# Patient Record
Sex: Male | Born: 1990 | Race: Black or African American | Hispanic: No | Marital: Single | Smoking: Current every day smoker
Health system: Southern US, Community
[De-identification: ages and names within clinical notes are randomized; demographics above are authoritative.]

## PROBLEM LIST (undated history)

## (undated) DIAGNOSIS — K5190 Ulcerative colitis, unspecified, without complications: Secondary | ICD-10-CM

---
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
## 2009-06-20 ENCOUNTER — Emergency Department (HOSPITAL_COMMUNITY): Admission: EM | Admit: 2009-06-20 | Discharge: 2009-06-20 | Payer: Self-pay | Admitting: Emergency Medicine

## 2009-06-23 ENCOUNTER — Inpatient Hospital Stay (HOSPITAL_COMMUNITY): Admission: EM | Admit: 2009-06-23 | Discharge: 2009-06-25 | Payer: Self-pay | Admitting: Emergency Medicine

## 2009-06-23 ENCOUNTER — Ambulatory Visit: Payer: Self-pay | Admitting: Family Medicine

## 2009-06-24 ENCOUNTER — Encounter: Payer: Self-pay | Admitting: Family Medicine

## 2009-07-02 ENCOUNTER — Ambulatory Visit: Payer: Self-pay | Admitting: Family Medicine

## 2009-07-02 DIAGNOSIS — K5289 Other specified noninfective gastroenteritis and colitis: Secondary | ICD-10-CM | POA: Insufficient documentation

## 2009-07-19 ENCOUNTER — Encounter (INDEPENDENT_AMBULATORY_CARE_PROVIDER_SITE_OTHER): Payer: Self-pay | Admitting: *Deleted

## 2009-07-19 DIAGNOSIS — F17200 Nicotine dependence, unspecified, uncomplicated: Secondary | ICD-10-CM

## 2010-08-16 NOTE — Miscellaneous (Signed)
Summary: [name]  Clinical Lists Changes  Problems: Added new problem of TOBACCO Prim Morace (ICD-305.1) 

## 2010-08-31 ENCOUNTER — Encounter: Payer: Self-pay | Admitting: *Deleted

## 2010-10-18 LAB — POCT I-STAT, CHEM 8
BUN: 5 mg/dL — ABNORMAL LOW (ref 6–23)
Calcium, Ion: 1.11 mmol/L — ABNORMAL LOW (ref 1.12–1.32)
Chloride: 100 meq/L (ref 96–112)
Creatinine, Ser: 1.1 mg/dL (ref 0.4–1.5)
Glucose, Bld: 93 mg/dL (ref 70–99)
HCT: 35 % — ABNORMAL LOW (ref 39.0–52.0)
Hemoglobin: 11.9 g/dL — ABNORMAL LOW (ref 13.0–17.0)
Potassium: 3.5 meq/L (ref 3.5–5.1)
Sodium: 140 mEq/L (ref 135–145)
TCO2: 27 mmol/L (ref 0–100)

## 2010-10-18 LAB — URINALYSIS, ROUTINE W REFLEX MICROSCOPIC
Bilirubin Urine: NEGATIVE
Nitrite: NEGATIVE
Specific Gravity, Urine: 1.026 (ref 1.005–1.030)
Urobilinogen, UA: 0.2 mg/dL (ref 0.0–1.0)
pH: 6 (ref 5.0–8.0)

## 2010-10-18 LAB — HELICOBACTER PYLORI ANTIGEN DET, STOOL

## 2010-10-18 LAB — BASIC METABOLIC PANEL
BUN: 5 mg/dL — ABNORMAL LOW (ref 6–23)
Calcium: 8.8 mg/dL (ref 8.4–10.5)
Creatinine, Ser: 1.16 mg/dL (ref 0.4–1.5)
GFR calc non Af Amer: >60 mL/min (ref >60)
Glucose, Bld: 109 mg/dL — ABNORMAL HIGH (ref 70–99)
Potassium: 3.5 mEq/L (ref 3.5–5.1)

## 2010-10-18 LAB — DIFFERENTIAL
Basophils Absolute: 0 10*3/uL (ref 0.0–0.1)
Basophils Relative: 0 % (ref 0–1)
Lymphocytes Relative: 17 % (ref 12–46)
Monocytes Absolute: 1.1 10*3/uL — ABNORMAL HIGH (ref 0.1–1.0)
Neutro Abs: 5.3 10*3/uL (ref 1.7–7.7)
Neutrophils Relative %: 68 % (ref 43–77)

## 2010-10-18 LAB — HIV ANTIBODY (ROUTINE TESTING W REFLEX): HIV: NONREACTIVE

## 2010-10-18 LAB — CBC
HCT: 29.9 % — ABNORMAL LOW (ref 39.0–52.0)
Hemoglobin: 10 g/dL — ABNORMAL LOW (ref 13.0–17.0)
Hemoglobin: 11.9 g/dL — ABNORMAL LOW (ref 13.0–17.0)
MCHC: 32.5 g/dL (ref 30.0–36.0)
Platelets: 354 10*3/uL (ref 150–400)
RBC: 3.55 MIL/uL — ABNORMAL LOW (ref 4.22–5.81)
RDW: 12.8 % (ref 11.5–15.5)

## 2010-10-18 LAB — COMPREHENSIVE METABOLIC PANEL
Alkaline Phosphatase: 41 U/L (ref 39–117)
BUN: 3 mg/dL — ABNORMAL LOW (ref 6–23)
Chloride: 102 mEq/L (ref 96–112)
Creatinine, Ser: 1.1 mg/dL (ref 0.4–1.5)
GFR calc non Af Amer: >60 mL/min (ref >60)
Glucose, Bld: 120 mg/dL — ABNORMAL HIGH (ref 70–99)
Potassium: 3.3 mEq/L — ABNORMAL LOW (ref 3.5–5.1)
Total Bilirubin: 0.6 mg/dL (ref 0.3–1.2)

## 2010-10-18 LAB — URINE MICROSCOPIC-ADD ON

## 2010-10-18 LAB — CLOSTRIDIUM DIFFICILE EIA
C difficile Toxins A+B, EIA: NEGATIVE
C difficile Toxins A+B, EIA: NEGATIVE

## 2010-10-18 LAB — SEDIMENTATION RATE: Sed Rate: 86 mm/hr — ABNORMAL HIGH (ref 0–16)

## 2010-10-18 LAB — GIARDIA/CRYPTOSPORIDIUM SCREEN(EIA): Cryptosporidium Screen (EIA): NEGATIVE

## 2010-10-18 LAB — STOOL CULTURE

## 2010-10-18 LAB — HEMOCCULT GUIAC POC 1CARD (OFFICE): Fecal Occult Bld: POSITIVE

## 2010-12-02 NOTE — Assessment & Plan Note (Signed)
[facility]                          [place] OFFICE NOTE   NAME:Barry Velez, Barry Velez                         MRN:          [id number]  DATE:02/19/2006                            DOB:          April 04, 1991    CHIEF COMPLAINT:  Headache.   HISTORY OF PRESENT ILLNESS:  Barry Velez is a 20 year old African-American male  who came to the office today because of headaches.  The patient states that  the headaches are going on for years.  They sometimes happen at the right  side of the head and they could be on the left or the middle.  Usually he  has a quick and complete response to Tylenol #2 p.o. one time.  There is no  associated nausea, vomiting, or dizziness.  He has not noticed any triggers  but suspects that sometimes salty food may cause the headaches.  As far as  the frequency, he states he has about four to five episodes a month.  There  are no premonitory symptoms.   PAST MEDICAL HISTORY:  Negative   FAMILY HISTORY:  1.  Mother used to have migraines earlier in her life but now she is      asymptomatic.  2.  No history of colon or prostate cancer.  3.  No history of heart disease.  4.  Grandmother has diabetes.   SOCIAL HISTORY:  Does not smoke.  Has drunk alcohol before and does it very  seldom.   CURRENT MEDICATIONS:  None on a routine basis.   ALLERGIES:  No known drug allergies.   REVIEW OF SYSTEMS:  Other than the headache, he does very well.  He does  well in school.  He denies any URI type of symptoms or allergies.   PHYSICAL EXAMINATION:  GENERAL:  The patient was alert and oriented and in  no acute distress.  VITAL SIGNS:  He weighs 217 pounds.  Blood pressure 130/68, pulse 72,  respirations 14.  LUNGS:  Clear to auscultation bilaterally.  CARDIOVASCULAR:  Regular rate and rhythm without a murmur.  EXTREMITIES:  No edema.  HEENT:  Ears, nose and throat are normal.  NEUROLOGIC:  Speech, gait and motor are intact.  Reflexes  are symmetric.  Extraocular movements are normal.  Pupils equal, round and reactive to light  and accommodation.   ASSESSMENT/PLAN:  1.  The patient has headaches that are recurrent.  There are no associated      symptoms or clear triggers.  Most likely however, he has migraines.      They respond very well to a low dose of Tylenol.  They are not      debilitating and he told me he has not skipped school due to the      migraines ever.  At this point I think he will be better served by      keeping Tylenol with him and use it p.r.n. rather than be on preventive      medicine daily.  The patient agrees with that approach and so does the      mother.  I  told him that should the migraines change, get more frequent      or more intense, to let me know.  I will consider either further work-up      or start some prophylactic medicine.  2.  Come back to the office p.r.n.                                   Willow Ora, MD   JP/MedQ  DD:  02/19/2006  DT:  02/20/2006  Job #:  [id number]

## 2011-02-07 IMAGING — CR DG ABDOMEN ACUTE W/ 1V CHEST
3 series · 3 of 3 positions shown · non-contrast
Comparison: None

CLINICAL DATA: Abdominal pain

ACUTE ABDOMEN SERIES (ABDOMEN 2 VIEW & CHEST 1 VIEW)

[w chest pa]
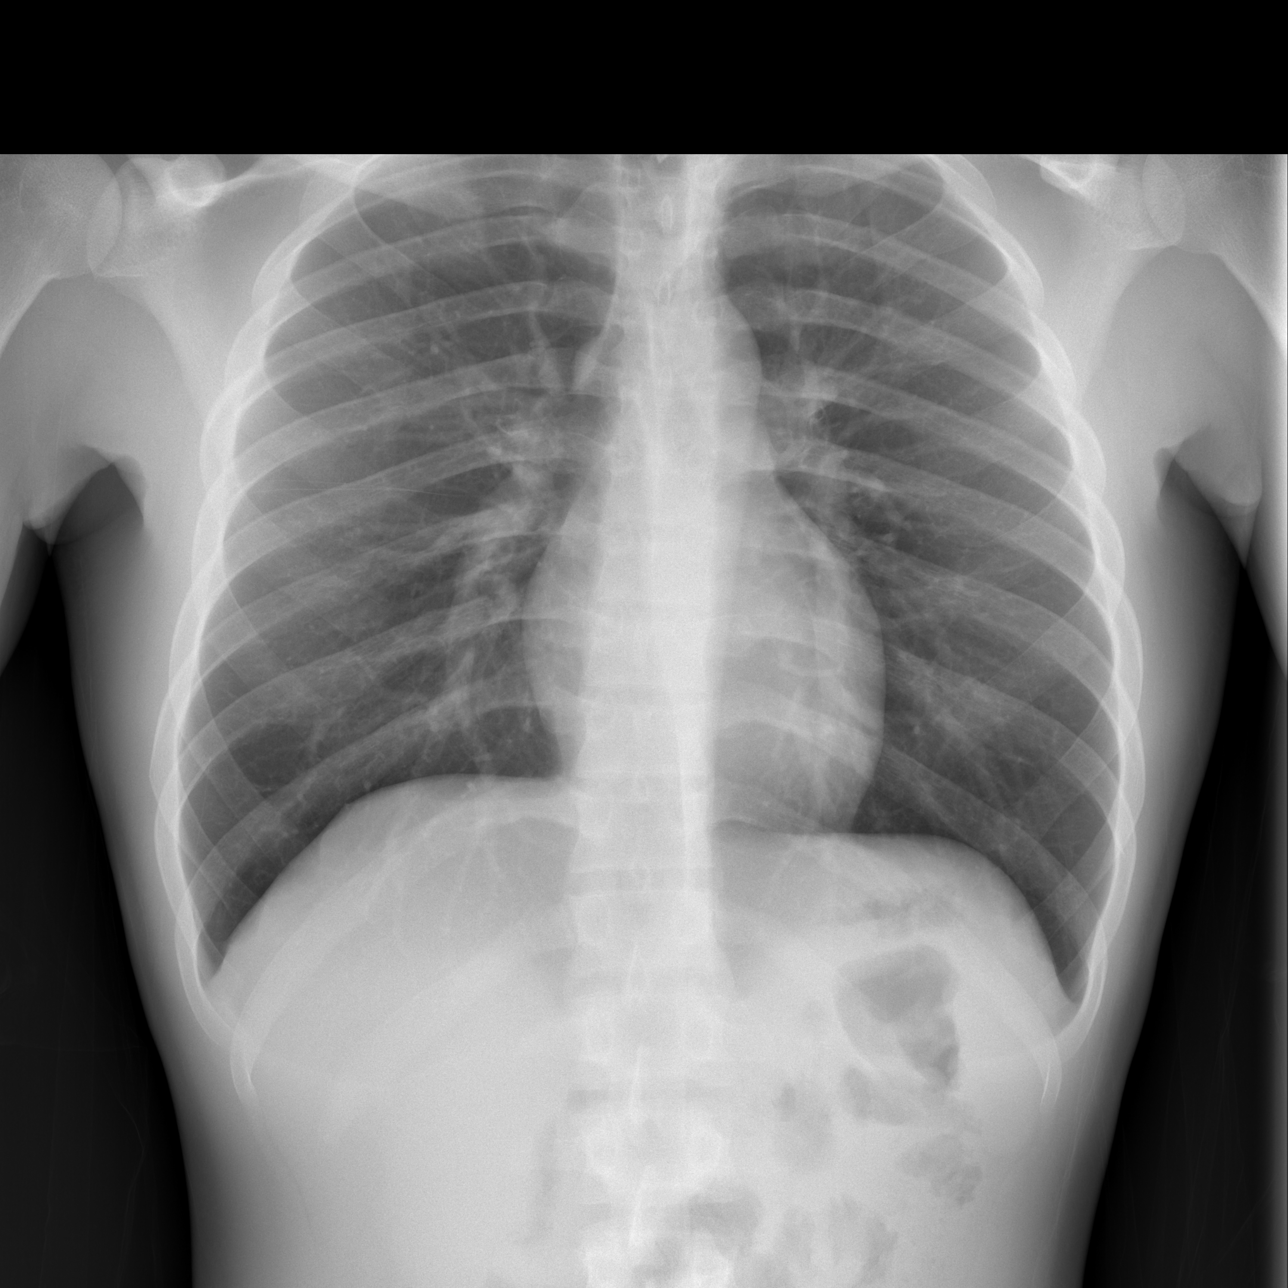

[w abdomen upright *]
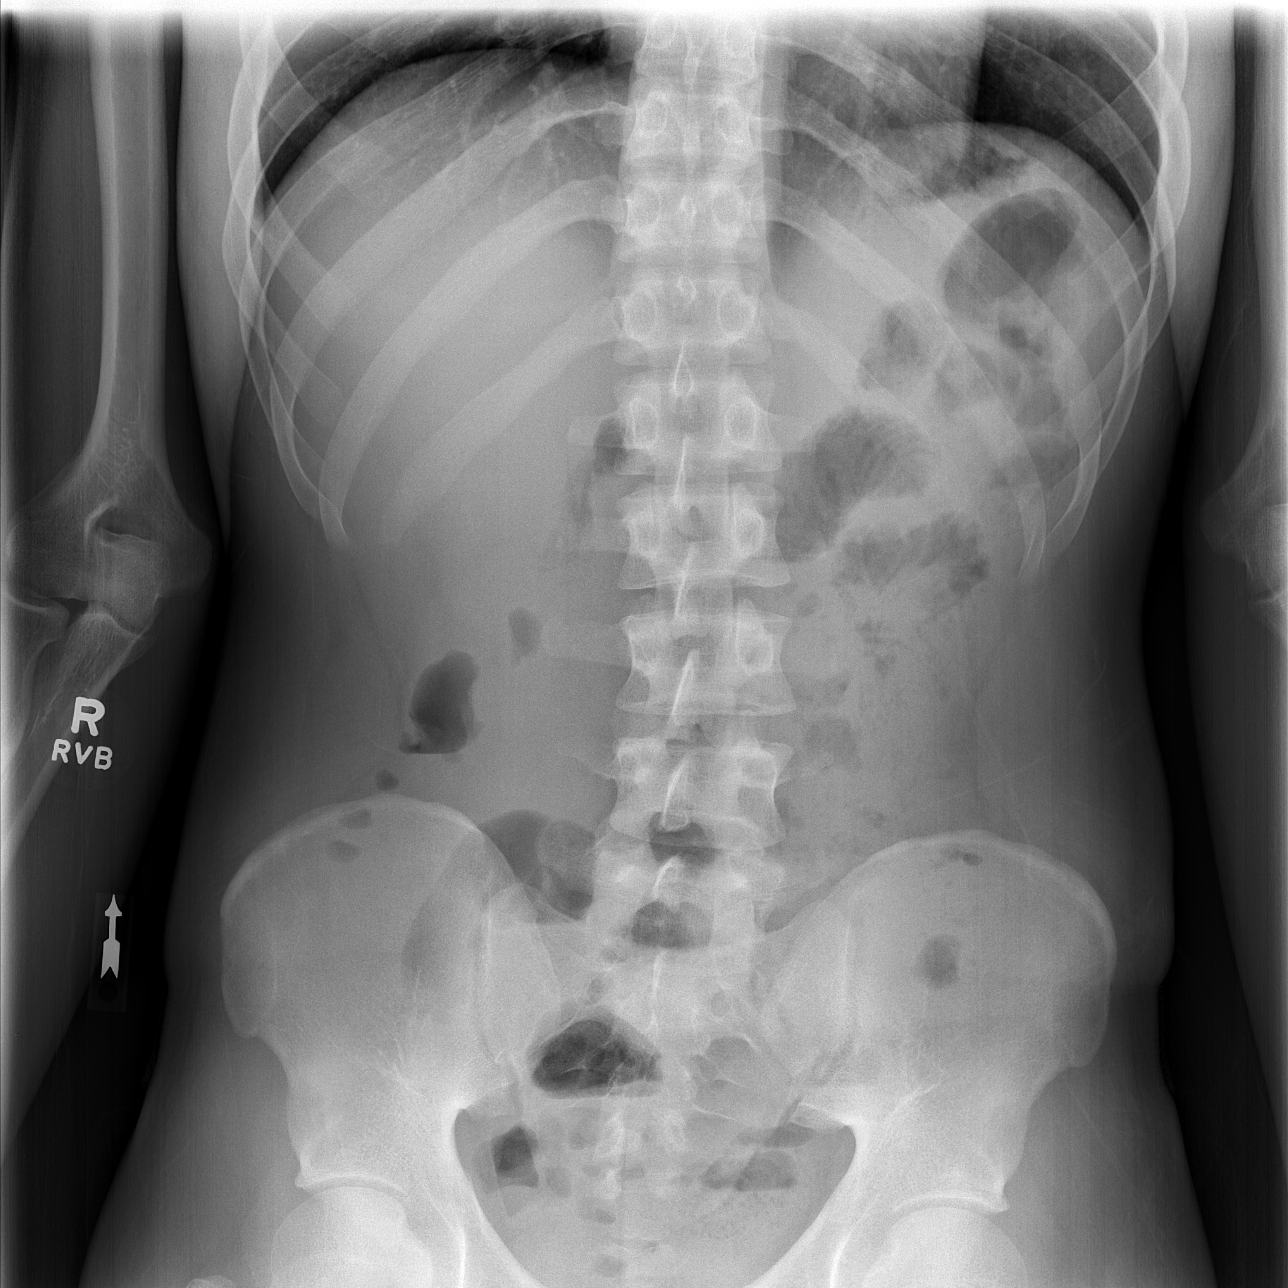

[t abdomen supine]
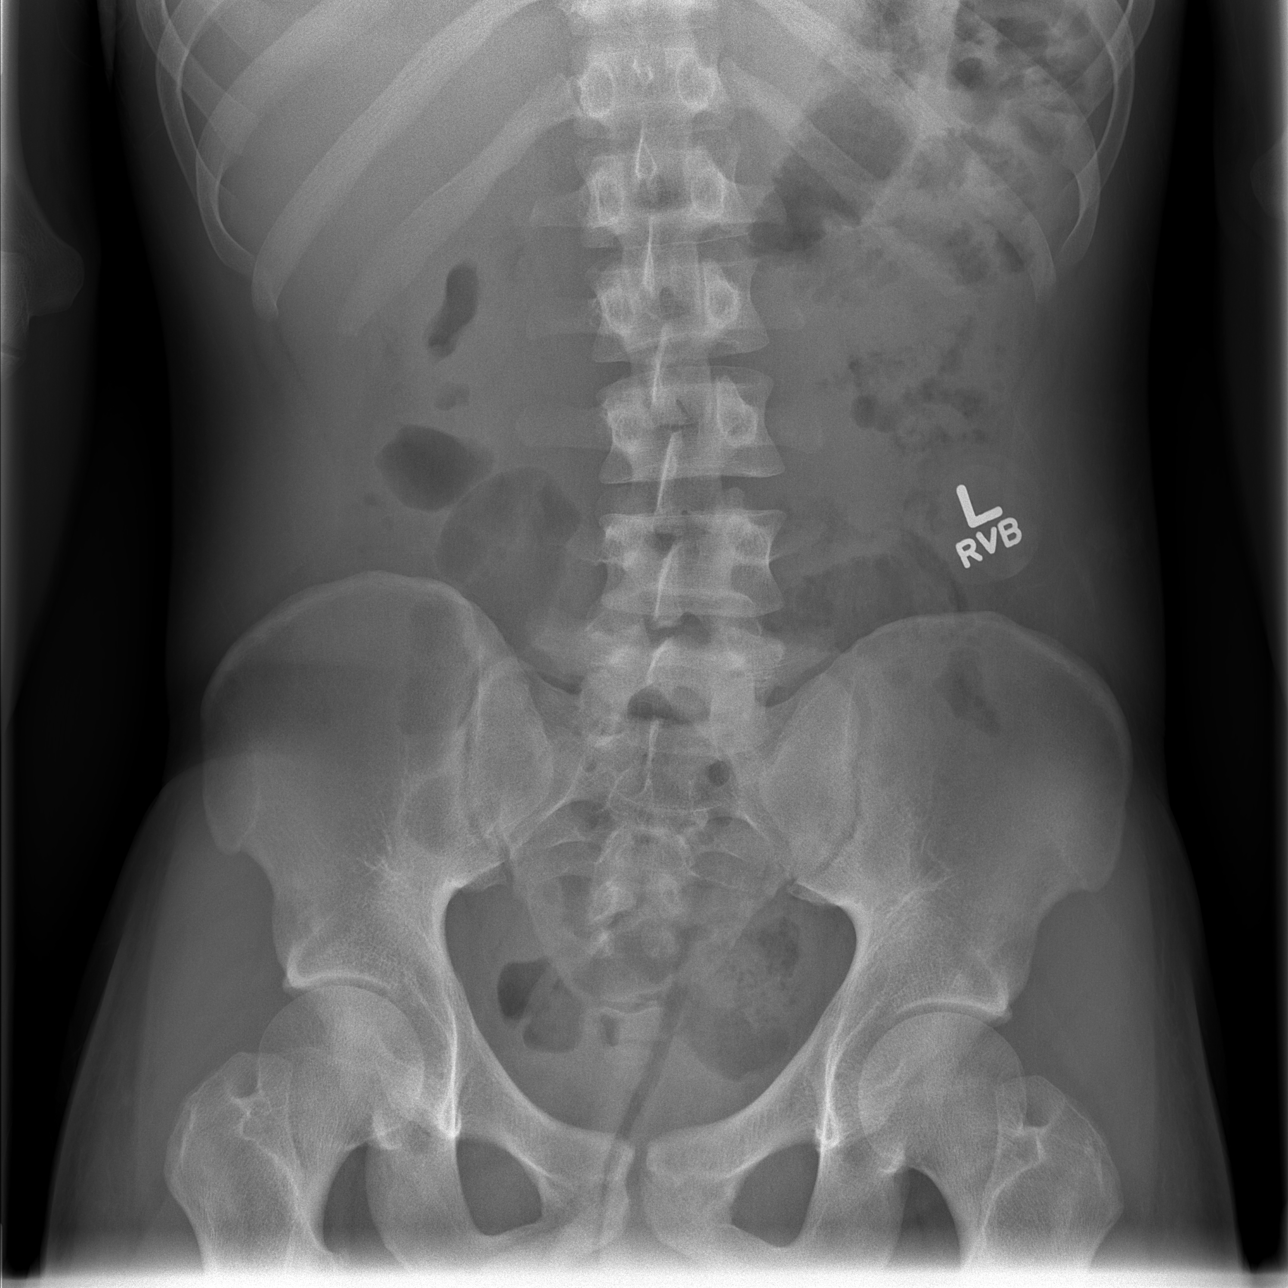

[3 of 3 positions shown; findings below may reference images not displayed]

FINDINGS: Heart size is normal and the lungs are clear.  No
infiltrate or effusion.

Mildly distended small bowel loops in left upper quadrant could be
due to early obstruction or ileus.  There is no free air.  The
colon is not dilated.  There are no abnormal calcifications and
there is no bony abnormality.
IMPRESSION: No acute cardiopulmonary disease.

Mildly dilated loops of jejunum which could be due to ileus or
partial obstruction.

## 2015-10-05 ENCOUNTER — Encounter (HOSPITAL_COMMUNITY): Payer: Self-pay | Admitting: Emergency Medicine

## 2015-10-05 ENCOUNTER — Emergency Department (HOSPITAL_COMMUNITY): Payer: Self-pay

## 2015-10-05 ENCOUNTER — Emergency Department (HOSPITAL_COMMUNITY)
Admission: EM | Admit: 2015-10-05 | Discharge: 2015-10-05 | Disposition: A | Discharge status: Home / Self Care | Payer: Self-pay | Attending: Emergency Medicine | Admitting: Emergency Medicine

## 2015-10-05 DIAGNOSIS — N453 Epididymo-orchitis: Secondary | ICD-10-CM | POA: Insufficient documentation

## 2015-10-05 DIAGNOSIS — Z8719 Personal history of other diseases of the digestive system: Secondary | ICD-10-CM | POA: Insufficient documentation

## 2015-10-05 DIAGNOSIS — F17200 Nicotine dependence, unspecified, uncomplicated: Secondary | ICD-10-CM | POA: Insufficient documentation

## 2015-10-05 DIAGNOSIS — N50819 Testicular pain, unspecified: Secondary | ICD-10-CM

## 2015-10-05 HISTORY — DX: Ulcerative colitis, unspecified, without complications: K51.90

## 2015-10-05 LAB — URINALYSIS, ROUTINE W REFLEX MICROSCOPIC
BILIRUBIN URINE: NEGATIVE
GLUCOSE, UA: NEGATIVE mg/dL
Ketones, ur: NEGATIVE mg/dL
Nitrite: NEGATIVE
PROTEIN: 100 mg/dL — AB
SPECIFIC GRAVITY, URINE: 1.026 (ref 1.005–1.030)
pH: 6 (ref 5.0–8.0)

## 2015-10-05 LAB — URINE MICROSCOPIC-ADD ON

## 2015-10-05 MED ORDER — ACETAMINOPHEN 500 MG PO TABS
1000.0000 mg | ORAL_TABLET | Freq: Once | ORAL | Status: AC
  Administered 2015-10-05: 1000 mg via ORAL
  Filled 2015-10-05: qty 2

## 2015-10-05 MED ORDER — ACETAMINOPHEN 325 MG PO TABS
650.0000 mg | ORAL_TABLET | Freq: Once | ORAL | Status: AC | PRN
  Administered 2015-10-05: 650 mg via ORAL
  Filled 2015-10-05: qty 2

## 2015-10-05 MED ORDER — CEFTRIAXONE SODIUM 250 MG IJ SOLR
250.0000 mg | Freq: Once | INTRAMUSCULAR | Status: AC
  Administered 2015-10-05: 250 mg via INTRAMUSCULAR
  Filled 2015-10-05: qty 250

## 2015-10-05 MED ORDER — CEPHALEXIN 500 MG PO CAPS: 500.0000 mg | ORAL_CAPSULE | Freq: Two times a day (BID) | ORAL | Status: AC

## 2015-10-05 MED ORDER — AZITHROMYCIN 250 MG PO TABS
1000.0000 mg | ORAL_TABLET | Freq: Once | ORAL | Status: AC
Start: 2015-10-05 — End: 2015-10-05
  Administered 2015-10-05: 1000 mg via ORAL
  Filled 2015-10-05: qty 4

## 2015-10-05 MED ORDER — FENTANYL CITRATE (PF) 100 MCG/2ML IJ SOLN
50.0000 ug | INTRAMUSCULAR | Status: DC | PRN
  Administered 2015-10-05: 50 ug via NASAL
  Filled 2015-10-05: qty 2

## 2015-10-05 NOTE — ED Notes (Signed)
PA made aware of pt's temp.  Tylenol ordered for pt.  Pt ok to discharge after receiving medication.

## 2015-10-05 NOTE — ED Provider Notes (Signed)
CSN: [id number]     Arrival date & time 10/05/15  1137 History   First MD Initiated Contact with Patient 10/05/15 1604     Chief Complaint  Patient presents with  . Groin Pain  . Back Pain     (Consider location/radiation/quality/duration/timing/severity/associated sxs/prior Treatment) HPI Barry Velez is a 25 y.o. male with a reported history of ulcerative colitis, comes in for evaluation of groin and back pain. Patient reports his symptoms started yesterday when he began to notice increased swelling and pain in his right testicle that radiates into his back. He does report he is sexually active with other men, most recent sexual intercourse one month ago. He also reports associated white penile discharge. He denies any fevers, chills, abdominal pain, nausea or vomiting, rectal pain or painful bowel movements, urinary symptoms, diarrhea or constipation. Nothing tried to improve symptoms. Palpation worsens the discomfort. Denies HIV or history of other immunosuppression  Past Medical History  Diagnosis Date  . Ulcerative colitis (HCC)    History reviewed. No pertinent past surgical history. No family history on file. Social History  Substance Use Topics  . Smoking status: Current Every Day Smoker -- 0.50 packs/day  . Smokeless tobacco: None  . Alcohol Use: Yes    Review of Systems A 10 point review of systems was completed and was negative except for pertinent positives and negatives as mentioned in the history of present illness     Allergies  Ibuprofen  Home Medications   Prior to Admission medications   Medication Sig Start Date End Date Taking? Authorizing Provider  acetaminophen (TYLENOL) 500 MG tablet Take 1,000 mg by mouth every 6 (six) hours as needed for moderate pain.   Yes Historical Provider, MD   BP 122/75 mmHg  Pulse 90  Temp(Src) 101.3 F (38.5 C) (Oral)  Resp 18  Ht 5\' 9"  (1.753 m)  Wt 74.844 kg  BMI 24.36 kg/m2  SpO2 100% Physical Exam   Constitutional: He is oriented to person, place, and time. He appears well-developed and well-nourished.  Overall well-appearing African-American male  HENT:  Head: Normocephalic and atraumatic.  Mouth/Throat: Oropharynx is clear and moist.  Eyes: Conjunctivae are normal. Pupils are equal, round, and reactive to light. Right eye exhibits no discharge. Left eye exhibits no discharge. No scleral icterus.  Neck: Neck supple.  Cardiovascular: Normal rate, regular rhythm and normal heart sounds.   Pulmonary/Chest: Effort normal and breath sounds normal. No respiratory distress. He has no wheezes. He has no rales.  Abdominal: Soft. There is no tenderness.  Genitourinary:  Unilateral swelling and tenderness noted in right testicle. No left-sided testicle pain or other abnormality. No perineal pain. No erythema or other skin breakdown. No penile discharge. No other lesions or deformities.  Musculoskeletal: He exhibits no tenderness.  Neurological: He is alert and oriented to person, place, and time.  Cranial Nerves II-XII grossly intact  Skin: Skin is warm and dry. No rash noted.  Psychiatric: He has a normal mood and affect.  Nursing note and vitals reviewed.   ED Course  Procedures (including critical care time) Labs Review Labs Reviewed  RPR  HIV ANTIBODY (ROUTINE TESTING)  URINALYSIS, ROUTINE W REFLEX MICROSCOPIC (NOT [facility])  GC/CHLAMYDIA PROBE AMP ([facility]) NOT AT [facility]    Imaging Review No results found. I have personally reviewed and evaluated these images and lab results as part of my medical decision-making.   EKG Interpretation None     Meds given in ED:  Medications  fentaNYL (SUBLIMAZE) injection 50 mcg (50 mcg Nasal Given 10/05/15 1154)  acetaminophen (TYLENOL) tablet 650 mg (650 mg Oral Given 10/05/15 1153)    New Prescriptions   No medications on file   Filed Vitals:   10/05/15 1142  BP: 122/75  Pulse: 90  Temp: 101.3 F (38.5 C)  TempSrc: Oral   Resp: 18  Height:  (1.753 m)  Weight: 74.844 kg  SpO2: 100%    MDM  Barry Velez is a 25 y.o. male who comes in for evaluation of right-sided testicle pain and swelling, symptom onset 1 day ago. Reports he is sexually active with other men. Denies history of HIV or other immunocompromise. On arrival, febrile at 101.3, given Tylenol, otherwise hemodynamically stable. He has a benign abdominal exam, GU exam yields moderately swollen right sided testicle with diffuse tenderness. Symptoms appear to be consistent with epididymoorchitis. No evidence of Fournier's gangrene or other infectious process. No rectal pain, painful bowel movements. Doubt prostatitis. We will obtain a scrotal ultrasound and plan to treat with antibiotics. Pending urinalysis, HIV, RPR Ultrasound consistent with epididymitis and likely orchitis on the right. No torsion. Urinalysis shows moderate hemoglobin, moderate leukocytes and many bacteria, question associated UTI. Plan to treat with Keflex. Treated with Rocephin and azithromycin in the emergency department. Discussed ED course, results as well as strict return precautions with patient. He verbalizes understanding and agrees with this plan. Final diagnoses:  Epididymoorchitis        Joycie Peek, PA-C 10/05/15 2357  Lavera Guise, MD 10/06/15 [id number]

## 2015-10-05 NOTE — Discharge Instructions (Signed)
Your evaluation in the emergency Department is complete. It appears that your symptoms are related to swollen right testicle. This could be due to a sexually transmitted infection, you were treated for this with antibiotics. You also may have a urine infection and he will also be treated with outpatient antibiotics for this also. Take all of your antibiotics as prescribed. Do not save or share them. Follow-up with your doctor or use the attached resources to find a primary care doctor. Return to ED for new or worsening symptoms as we discussed.  Orchitis Orchitis is swelling (inflammation) of a testicle caused by infection. Testicles are the male organs that produce sperm. The testicles are held in a fleshy sac (scrotum) located behind the penis. Orchitis usually affects only one testicle, but it can occur in both. The condition can develop suddenly. Orchitis can be caused by many different kinds of bacteria and viruses. CAUSES Orchitis can be caused by either a bacterial or viral infection. Bacterial Infections  These often occur along with an infection of the coiled tube that collects sperm and sits on top of the testicle (epididymis).  In men who are not sexually active, bacterial orchitis usually starts as a urinary tract infection and spreads to the testicle.  In sexually active men, sexually transmitted infections are the most common cause of bacterial orchitis. These can include:  Gonorrhea.  Chlamydia. Viral Infections  Mumps is still the most common cause of viral orchitis, though mumps is now rare in many areas because of vaccination.  Other viruses that can cause orchitis include:  The chickenpox virus (varicella-zoster virus).  The virus that causes mononucleosis (Epstein-Barr virus). RISK FACTORS Boys and men who have not been vaccinated against mumps are at risk for mumps orchitis. Risk factors for bacterial orchitis include:  Frequent urinary tract  infections.  High-risk sexual behaviors.  Having a sexual partner with a sexually transmitted infection.  Having had urinary tract surgery.  Using a tube passed through the penis to drain urine (Foley catheter).  An enlarged prostate gland. SIGNS AND SYMPTOMS The most common symptoms of orchitis are swelling and pain in the scrotum. Other signs and symptoms may include:  Feeling generally sick (malaise).  Fever and chills.  Painful urination.  Painful ejaculation.  Blood or discharge from the penis.  Nausea.  Headache.  Fatigue. DIAGNOSIS Your health care provider may suspect orchitis if you have a painful, swollen testicle along with other signs and symptoms of the condition. A physical exam will be done. Tests may also be done to help your health care provider make a diagnosis. These may include:  A blood test to check for signs of infection.  A urine test to check for a urinary tract infection.  Using a swab to collect a fluid sample from the tip of the penis to test for sexually transmitted infections.  Taking an image of the testicle using sound waves and a computer (testicular ultrasound). TREATMENT Treatment of orchitis depends on the cause. For orchitis caused by a bacterial infection, your health care provider will most likely prescribe antibiotic medicines. Bacterial infections usually clear up within a few days. Both viral infections and bacterial infections may be treated with:  Bed rest.  Anti-inflammatory medicines.  Pain medicines.  Elevating the scrotum and applying ice. HOME CARE INSTRUCTIONS  Rest as directed by your health care provider.  Take medicines only as directed by your health care provider.  If you were prescribed an antibiotic medicine, finish it all even  if you start to feel better.  Elevate your scrotum and apply ice as directed:  Put ice in a plastic bag.  Place a small towel or pillow between your legs.  Rest your  scrotum on the pillow or towel.  Place another towel between your skin and the plastic bag.  Leave the ice on for 20 minutes, 2-3 times a day. SEEK MEDICAL CARE IF:  You have a fever.  Pain and swelling have not gotten better after 3 days. SEEK IMMEDIATE MEDICAL CARE IF:  Your pain is getting worse.  The swelling in your testicle gets worse.   This information is not intended to replace advice given to you by your health care provider. Make sure you discuss any questions you have with your health care provider.   Document Released: 06/30/2000 Document Revised: 07/24/2014 Document Reviewed: 11/20/2013 Elsevier Interactive Patient Education [employer]

## 2015-10-05 NOTE — ED Notes (Signed)
Pain medication given in Triage. Patient advised about side effects of medications and  to avoid driving for a minimum of 4 hours.  

## 2015-10-05 NOTE — ED Notes (Addendum)
Patient states he is having pain in his groin area and lower back since yesterday. Patient states he had white discharge from his penis yesterday. Denies in urinary symptoms, nausea, vomiting, or diarrhea. Patient has a fever in triage.

## 2015-10-06 LAB — RPR: RPR Ser Ql: NONREACTIVE

## 2015-10-07 ENCOUNTER — Telehealth: Payer: Self-pay | Admitting: Infectious Disease

## 2015-10-07 LAB — HIV 1/2 AB DIFFERENTIATION
HIV 1 AB: POSITIVE — AB
HIV 2 AB: NEGATIVE

## 2015-10-07 LAB — HIV ANTIBODY (ROUTINE TESTING W REFLEX)

## 2015-10-07 NOTE — Telephone Encounter (Signed)
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 Established infection--but pt denied to ED staff history of HIV  Does not look like he has PCP  I will call the patient and we can bring into care

## 2015-10-08 ENCOUNTER — Telehealth: Payer: Self-pay | Admitting: Infectious Disease

## 2015-10-08 NOTE — Telephone Encounter (Signed)
He will come next Friday for visit at 9am

## 2015-10-08 NOTE — Telephone Encounter (Signed)
Patient could only come on a Friday. Can we bring him in next Friday at 9am to see me (I dont have a clinic scheduled that day but I dont see any providers on the calendar)  He will need intake labs, Juanell FairlyRyan White unless he has insurance)  I have asked him to used condoms with all sex (he was diagnosed with STI in ED)

## 2015-10-15 ENCOUNTER — Ambulatory Visit: Payer: Self-pay | Admitting: Infectious Disease

## 2015-12-08 ENCOUNTER — Encounter (HOSPITAL_COMMUNITY): Payer: Self-pay

## 2015-12-08 ENCOUNTER — Emergency Department (HOSPITAL_COMMUNITY)
Admission: EM | Admit: 2015-12-08 | Discharge: 2015-12-08 | Disposition: A | Discharge status: Home / Self Care | Payer: Self-pay | Attending: Emergency Medicine | Admitting: Emergency Medicine

## 2015-12-08 DIAGNOSIS — K0889 Other specified disorders of teeth and supporting structures: Secondary | ICD-10-CM | POA: Insufficient documentation

## 2015-12-08 DIAGNOSIS — F17210 Nicotine dependence, cigarettes, uncomplicated: Secondary | ICD-10-CM | POA: Insufficient documentation

## 2015-12-08 DIAGNOSIS — Z79899 Other long term (current) drug therapy: Secondary | ICD-10-CM | POA: Insufficient documentation

## 2015-12-08 DIAGNOSIS — Z792 Long term (current) use of antibiotics: Secondary | ICD-10-CM | POA: Insufficient documentation

## 2015-12-08 MED ORDER — PENICILLIN V POTASSIUM 500 MG PO TABS: 500.0000 mg | ORAL_TABLET | Freq: Four times a day (QID) | ORAL | Status: AC

## 2015-12-08 NOTE — ED Notes (Signed)
Patient c/o right lower dental pain and facial swelling x 1 week. Patient states increased pain when he bites into something. Patient denies any difficulty swallowing or breathing.

## 2015-12-08 NOTE — ED Provider Notes (Signed)
CSN: [id number]     Arrival date & time 12/08/15  1014 History   First MD Initiated Contact with Patient 12/08/15 1100     Chief Complaint  Patient presents with  . Dental Pain  . Facial Swelling     (Consider location/radiation/quality/duration/timing/severity/associated sxs/prior Treatment) HPI Barry Velez is a 25 y.o. male here for evaluation of right mandibular dental pain, throbbing in nature, onset roughly 1 week ago. He reports associated swelling. The pain is improved spontaneously. Does not have a dentist. Worse with biting and chewing. Denies any fevers, chills, cough, sore throat, neck pain, difficulty swallowing or breathing. No other modifying factors.  Past Medical History  Diagnosis Date  . Ulcerative colitis (HCC)    History reviewed. No pertinent past surgical history. Family History  Problem Relation Age of Onset  . Hypertension Mother   . Heart failure Father    Social History  Substance Use Topics  . Smoking status: Current Every Day Smoker -- 0.50 packs/day    Types: Cigarettes  . Smokeless tobacco: Never Used  . Alcohol Use: Yes     Comment: socially    Review of Systems A 10 point review of systems was completed and was negative except for pertinent positives and negatives as mentioned in the history of present illness     Allergies  Ibuprofen  Home Medications   Prior to Admission medications   Medication Sig Start Date End Date Taking? Authorizing Provider  acetaminophen (TYLENOL) 500 MG tablet Take 1,000 mg by mouth every 6 (six) hours as needed for moderate pain.    Historical Provider, MD  cephALEXin (KEFLEX) 500 MG capsule Take 1 capsule (500 mg total) by mouth 2 (two) times daily. 10/05/15   Joycie PeekBenjamin Win Guajardo, PA-C  penicillin v potassium (VEETID) 500 MG tablet Take 1 tablet (500 mg total) by mouth 4 (four) times daily. 12/08/15 12/15/15  Dimitris Shanahan, PA-C   BP 130/67 mmHg  Pulse 82  Temp(Src) 99 F (37.2 C) (Oral)  Resp 16  SpO2  100% Physical Exam  Constitutional: He appears well-developed and well-nourished. No distress.  HENT:  Head: Normocephalic.  Discomfort located to right mandibular molar/with some tooth. Overall appropriate dentition.  Mucous membranes are moist. No unilateral tonsillar swelling, uvula midline, no glossal swelling or elevation. No trismus. No fluctuance or evidence of a drainable abscess. No other evidence of emergent infection, Retropharyngeal or Peritonsillar abscess, Ludwig or Vincents angina. Tolerating secretions well. Patent airway   Eyes: Conjunctivae and EOM are normal.  Neck: Normal range of motion. Neck supple.  Pulmonary/Chest: Effort normal. No respiratory distress.  Abdominal: Soft. He exhibits no distension.  Musculoskeletal: Normal range of motion.  Neurological: He is alert.  Skin: He is not diaphoretic.  Psychiatric: He has a normal mood and affect.    ED Course  Procedures (including critical care time) Labs Review Labs Reviewed - No data to display  Imaging Review No results found. I have personally reviewed and evaluated these images and lab results as part of my medical decision-making.   EKG Interpretation None     Filed Vitals:   12/08/15 1019  BP: 130/67  Pulse: 82  Temp: 99 F (37.2 C)  TempSrc: Oral  Resp: 16  SpO2: 100%   Meds given in ED:  Medications - No data to display  New Prescriptions   PENICILLIN V POTASSIUM (VEETID) 500 MG TABLET    Take 1 tablet (500 mg total) by mouth 4 (four) times daily.    MDM  Here for evaluation of dental pain. On exam, there is no evidence of a drainable abscess. No trismus, glossal elevation, unilateral tonsillar swelling. No evidence of retropharyngeal or peritonsillar abscess or Ludwig angina. Patient declines dental block. Discharged with outpatient dental resources as well as referral to on-call dentistry. Also given prescription for anti-inflammatories and antibiotics. Overall, appears well, nontoxic  and appropriate for discharge.  Final diagnoses:  Greggory Stallion, PA-C 12/08/15 1130  Azalia Bilis, MD 12/08/15 1137

## 2015-12-08 NOTE — Discharge Instructions (Signed)
It is important for you to take your antibiotics as prescribed. Do not save or share them. Follow-up with your dentist or use the attached resource guide to help find a dentist. Return to ED for any new or worsening symptoms as we discussed.  [facility]  Community Service Learning [facility]  [address]  [place], [place] [address]  Phone [phone]  [facility] [employer] [facility] in [place], [place] exemplifies the [employer] vision to improve the health and quality of life of all [place]ians by Public house manager with a passion to care for the underserved and by leading the nation in community-based, service learning oral health education. We are committed to offering comprehensive general dental services for adults, children and special needs patients in a safe, caring and professional setting.  Appointments: Our clinic is open Monday through Friday 8:00 a.m. until 5:00 p.m. The amount of time scheduled for an appointment depends on the patients specific needs. We ask that you keep your appointed time for care or provide 24-hour notice of all appointment changes. Parents or legal guardians must accompany minor children.  Payment for Services: Medicaid and other insurance plans are welcome. Payment for services is due when services are rendered and may be made by cash or credit card. If you have dental insurance, we will assist you with your claim submission.   Emergencies: Emergency services will be provided Monday through Friday on a walk-in basis. Please arrive early for emergency services. After hours emergency services will be provided for patients of record as required.  Services:  Medical illustrator Dentistry  Oral Surgery - Extractions  Root Canals  Sealants and Tooth Colored Fillings  Crowns and Bridges  Dentures and  Partial Dentures  Implant Services  Periodontal Services and Cleanings  Cosmetic Building services engineer  [employer] [facility]  [employer] Guide Dental [employer] is a great source of information about community services available.  Access by dialing 2-1-1 from anywhere in [place], or by website -  PooledIncome.pl.   Other Local Resources (Updated 07/2015)  Dental  Care   Services    Phone Number and Address  Cost  [facility] For children 39 - 4 years of age:   Cleaning  Tooth brushing/flossing instruction  Sealants, fillings, crowns  Extractions  Emergency treatment  [phone] [address] [place], [place] [address] Charges based on family income.  Medicaid and some insurance plans accepted.     [facility] - [facility], fillings, crowns  Extractions  Emergency treatment [phone] [address] [place], [place]  Pregnant women 32 years of age or older with a Medicaid card  [facility] - [place]  Cleaning  Sealants, fillings, crowns  Extractions  Emergency treatment [phone] [address] [place], [place] Pregnant women 53 years of age or older with a Medicaid card  [facility] - [facility] For children 70 - 18 years of age:   Cleaning  Tooth brushing/flossing instruction  Sealants, fillings, crowns  Extractions  Emergency treatment Limited orthodontic services for patients with Medicaid [phone] [address] [place], [place] [address] Medicaid and [facility] Health Choice cover for children up to age 55 and pregnant women.  Parents of children up to age 11 without Medicaid pay  a reduced fee at time of service.  [facility] [place] For children 570 - 27 years of age:   Cleaning  Tooth  brushing/flossing instruction  Sealants, fillings, crowns  Extractions  Emergency treatment Limited orthodontic services for patients with Medicaid [phone] [address] [place], [place].  Medicaid and [facility] Health Choice cover for children up to age 27 and pregnant women.  Parents of children up to age 27 without Medicaid pay a reduced fee.  [facility]  Cleaning  Sealants, fillings, crowns  Extractions  Hours: Tuesdays and Thursdays, 4:15 - 8 pm [phone] [address], [address] [place], [place] [id number] Services free of charge to [place] residents ages 18-64 who do not have health insurance, Medicare, [place], or [place] benefits and fall within federal poverty guidelines  [facility]    Provides dental care in addition to primary medical care, nutritional counseling, and pharmacy:  Nurse, mental healthCleaning  Sealants, fillings, crowns  Extractions                  [phone] [facility], [address] [place], [place]  [phone] Phineas Realharles Drew [facility], [address] [place], [place]  [phone] [facility] [place], [place]  [phone] [facility], [address] [place], [place]  [phone] [facility] [address] [place], [place] Accepts [place], [place], most insurance.  Also provides services available to all with fees adjusted based on ability to pay.    [facility]  Cleaning  Tooth brushing/flossing instruction  Sealants, fillings, crowns  Extractions  Emergency treatment Hours: Tuesdays, Thursdays, and Fridays from 8 am to 5 pm by appointment only. [phone] [address] [place], [place] [phone] [facility] [place] [facility] residents with Medicaid (depending on eligibility) and children with [facility] Health Choice - call for more information.  [facility]  [facility]  Extractions only  Hours: 2nd and 4th Thursday of each month from 6:30 am - 9 am.   [phone] [place], [place] [id number] Ages 5918 and older only.  Patients are seen on a first come, first served basis.  [employer]  [employer]  Fillings  Extractions  Orthodontics  Endodontics  Implants/Crowns/Bridges  Complete and partial dentures [phone] [place], [place] Patients must complete an application for services.  There is often a waiting list.
